# Patient Record
Sex: Male | Born: 1980 | Race: White | Hispanic: No | Marital: Married | Smoking: Never smoker
Health system: Southern US, Community
[De-identification: ages and names within clinical notes are randomized; demographics above are authoritative.]

## PROBLEM LIST (undated history)

## (undated) HISTORY — PX: FEMUR FRACTURE SURGERY: SHX633

---

## 2004-08-26 ENCOUNTER — Encounter: Payer: Self-pay | Admitting: Emergency Medicine

## 2004-08-26 ENCOUNTER — Ambulatory Visit (HOSPITAL_COMMUNITY): Admission: EM | Admit: 2004-08-26 | Discharge: 2004-08-26 | Payer: Self-pay | Admitting: Emergency Medicine

## 2004-09-05 ENCOUNTER — Ambulatory Visit (HOSPITAL_COMMUNITY): Admission: RE | Admit: 2004-09-05 | Discharge: 2004-09-05 | Payer: Self-pay | Admitting: Urology

## 2010-11-02 ENCOUNTER — Emergency Department (HOSPITAL_COMMUNITY)
Admission: EM | Admit: 2010-11-02 | Discharge: 2010-11-02 | Disposition: A | Discharge status: Home / Self Care | Payer: BC Managed Care – PPO | Attending: Emergency Medicine | Admitting: Emergency Medicine

## 2010-11-02 DIAGNOSIS — R109 Unspecified abdominal pain: Secondary | ICD-10-CM | POA: Insufficient documentation

## 2010-11-02 DIAGNOSIS — N200 Calculus of kidney: Secondary | ICD-10-CM | POA: Insufficient documentation

## 2010-11-02 LAB — URINALYSIS, ROUTINE W REFLEX MICROSCOPIC
Bilirubin Urine: NEGATIVE
Glucose, UA: NEGATIVE mg/dL
Ketones, ur: NEGATIVE mg/dL
Leukocytes, UA: NEGATIVE
Nitrite: NEGATIVE
Protein, ur: NEGATIVE mg/dL
Specific Gravity, Urine: 1.028 (ref 1.005–1.030)
Urobilinogen, UA: 0.2 mg/dL (ref 0.0–1.0)
pH: 5.5 (ref 5.0–8.0)

## 2010-11-02 LAB — URINE MICROSCOPIC-ADD ON

## 2011-03-17 ENCOUNTER — Encounter (HOSPITAL_COMMUNITY): Payer: Self-pay | Admitting: *Deleted

## 2011-03-17 ENCOUNTER — Emergency Department (HOSPITAL_COMMUNITY)
Admission: EM | Admit: 2011-03-17 | Discharge: 2011-03-17 | Disposition: A | Discharge status: Home / Self Care | Payer: BC Managed Care – PPO | Attending: Emergency Medicine | Admitting: Emergency Medicine

## 2011-03-17 ENCOUNTER — Emergency Department (HOSPITAL_COMMUNITY): Payer: BC Managed Care – PPO

## 2011-03-17 DIAGNOSIS — Z9889 Other specified postprocedural states: Secondary | ICD-10-CM | POA: Insufficient documentation

## 2011-03-17 DIAGNOSIS — N201 Calculus of ureter: Secondary | ICD-10-CM | POA: Insufficient documentation

## 2011-03-17 DIAGNOSIS — R109 Unspecified abdominal pain: Secondary | ICD-10-CM | POA: Insufficient documentation

## 2011-03-17 DIAGNOSIS — N200 Calculus of kidney: Secondary | ICD-10-CM

## 2011-03-17 LAB — URINALYSIS, ROUTINE W REFLEX MICROSCOPIC
Bilirubin Urine: NEGATIVE
Glucose, UA: NEGATIVE mg/dL
Ketones, ur: NEGATIVE mg/dL
Leukocytes, UA: NEGATIVE
Nitrite: NEGATIVE
Protein, ur: 30 mg/dL — AB
Specific Gravity, Urine: 1.033 — ABNORMAL HIGH (ref 1.005–1.030)
Urobilinogen, UA: 0.2 mg/dL (ref 0.0–1.0)
pH: 5.5 (ref 5.0–8.0)

## 2011-03-17 LAB — URINE MICROSCOPIC-ADD ON

## 2011-03-17 MED ORDER — OXYCODONE-ACETAMINOPHEN 5-325 MG PO TABS: 2.0000 | ORAL_TABLET | ORAL | Status: AC | PRN

## 2011-03-17 MED ORDER — TAMSULOSIN HCL 0.4 MG PO CAPS: 0.4000 mg | ORAL_CAPSULE | Freq: Every day | ORAL | Status: AC

## 2011-03-17 NOTE — ED Notes (Signed)
Pt denies any vomiting

## 2011-03-17 NOTE — Discharge Instructions (Signed)
You had a 4.5x78mm kidney stone in your left ureter. There is a high likelihood is that you may be able to pass your stone in the next 3 days. Use strainer when urinate.  Collect stone and bring it to your urologist for analysis.  Return if symptoms worsen.  Do not drive or operate heavy machinery while taking pain medication as it can cause drowsiness.   Kidney Stones Kidney stones (ureteral lithiasis) are deposits that form inside your kidneys. The intense pain is caused by the stone moving through the urinary tract. When the stone moves, the ureter goes into spasm around the stone. The stone is usually passed in the urine.  CAUSES   A disorder that makes certain neck glands produce too much parathyroid hormone (primary hyperparathyroidism).   A buildup of uric acid crystals.   Narrowing (stricture) of the ureter.   A kidney obstruction present at birth (congenital obstruction).   Previous surgery on the kidney or ureters.   Numerous kidney infections.  SYMPTOMS   Feeling sick to your stomach (nauseous).   Throwing up (vomiting).   Blood in the urine (hematuria).   Pain that usually spreads (radiates) to the groin.   Frequency or urgency of urination.  DIAGNOSIS   Taking a history and physical exam.   Blood or urine tests.   Computerized X-ray scan (CT scan).   Occasionally, an examination of the inside of the urinary bladder (cystoscopy) is performed.  TREATMENT   Observation.   Increasing your fluid intake.   Surgery may be needed if you have severe pain or persistent obstruction.  The size, location, and chemical composition are all important variables that will determine the proper choice of action for you. Talk to your caregiver to better understand your situation so that you will minimize the risk of injury to yourself and your kidney.  HOME CARE INSTRUCTIONS   Drink enough water and fluids to keep your urine clear or pale yellow.   Strain all urine through the  provided strainer. Keep all particulate matter and stones for your caregiver to see. The stone causing the pain may be as small as a grain of salt. It is very important to use the strainer each and every time you pass your urine. The collection of your stone will allow your caregiver to analyze it and verify that a stone has actually passed.   Only take over-the-counter or prescription medicines for pain, discomfort, or fever as directed by your caregiver.   Make a follow-up appointment with your caregiver as directed.   Get follow-up X-rays if required. The absence of pain does not always mean that the stone has passed. It may have only stopped moving. If the urine remains completely obstructed, it can cause loss of kidney function or even complete destruction of the kidney. It is your responsibility to make sure X-rays and follow-ups are completed. Ultrasounds of the kidney can show blockages and the status of the kidney. Ultrasounds are not associated with any radiation and can be performed easily in a matter of minutes.  SEEK IMMEDIATE MEDICAL CARE IF:   Pain cannot be controlled with the prescribed medicine.   You have a fever.   The severity or intensity of pain increases over 18 hours and is not relieved by pain medicine.   You develop a new onset of abdominal pain.   You feel faint or pass out.  MAKE SURE YOU:   Understand these instructions.   Will watch your condition.  Will get help right away if you are not doing well or get worse.  Document Released: 12/30/2004 Document Revised: 12/19/2010 Document Reviewed: 04/27/2009 [facility] Patient Information 2012 [place], [place].

## 2011-03-17 NOTE — ED Notes (Signed)
Pt states he started to have frequent urination about 3 weeks ago. Pt states he started to have left side flank pain today. Pt states he has been to see a urologist. Pt states they can not find anything but the pain is getting worst

## 2011-03-17 NOTE — ED Provider Notes (Signed)
History     CSN: [id number]  Arrival date & time 03/17/11  1315   First MD Initiated Contact with Patient 03/17/11 1441      Chief Complaint  Patient presents with  . Flank Pain    (Consider location/radiation/quality/duration/timing/severity/associated sxs/prior treatment) HPI  A 31 year old [place] with history of kidney stones that required lithotripsy 6 years ago is presenting to the ED with a chief complaint of left flank pain. Patient states since his lithotripsy he has been having intermittent left flank pain very similar to the site of his prior kidney stone. Pain usually lasted for 30 minutes and usually improves with ibuprofen. Pain is described as a sharp/throbbing sensation that is nonradiating. Today the pain returns but has not resolved like it normal does. He tries taking some ibuprofen without relief. He denies fever, headache, chest pain, shortness of breath, abdominal pain, dysuria although he does notice some urinary frequency. No evidence of hematuria. He is having no trouble having bowel movements and denies constipation or diarrhea. He denies any recent trauma. Pain that has not improved with movement. He denies any recent strenuous exercise.  History reviewed. No pertinent past medical history.  Past Surgical History  Procedure Date  . Femur fracture surgery     No family history on file.  History  Substance Use Topics  . Smoking status: Never Smoker   . Smokeless tobacco: Not on file  . Alcohol Use: Yes      Review of Systems  All other systems reviewed and are negative.    Allergies  Amoxicillin  Home Medications   Current Outpatient Rx  Name Route Sig Dispense Refill  . IBUPROFEN 200 MG PO TABS Oral Take 600 mg by mouth every 6 (six) hours as needed. For pain      BP 144/89  Pulse 65  Temp(Src) 98.1 F (36.7 C) (Oral)  Resp 18  Ht 5\' 10"  (1.778 m)  Wt 190 lb (86.183 kg)  BMI 27.26 kg/m2  SpO2 99%  Physical Exam  Nursing note and  vitals reviewed. Constitutional:       Awake, alert, nontoxic appearance  HENT:  Head: Atraumatic.  Eyes: Right eye exhibits no discharge. Left eye exhibits no discharge.  Neck: Neck supple.  Pulmonary/Chest: Effort normal. He exhibits no tenderness.  Abdominal: There is no tenderness. There is CVA tenderness. There is no rebound.  Musculoskeletal: He exhibits no tenderness.       Baseline ROM, no obvious new focal weakness  Neurological:       Mental status and motor strength appears baseline for patient and situation  Skin: No rash noted.     Psychiatric: He has a normal mood and affect.    ED Course  Procedures (including critical care time)   Labs Reviewed  URINALYSIS, ROUTINE W REFLEX MICROSCOPIC   No results found.   No diagnosis found.  Results for orders placed during the hospital encounter of 03/17/11  URINALYSIS, ROUTINE W REFLEX MICROSCOPIC      Component Value Range   Color, Urine YELLOW  YELLOW    APPearance CLOUDY (*) CLEAR    Specific Gravity, Urine 1.033 (*) 1.005 - 1.030    pH 5.5  5.0 - 8.0    Glucose, UA NEGATIVE  NEGATIVE (mg/dL)   Hgb urine dipstick LARGE (*) NEGATIVE    Bilirubin Urine NEGATIVE  NEGATIVE    Ketones, ur NEGATIVE  NEGATIVE (mg/dL)   Protein, ur 30 (*) NEGATIVE (mg/dL)   Urobilinogen, UA 0.2  0.0 - 1.0 (mg/dL)   Nitrite NEGATIVE  NEGATIVE    Leukocytes, UA NEGATIVE  NEGATIVE   URINE MICROSCOPIC-ADD ON      Component Value Range   Squamous Epithelial / LPF FEW (*) RARE    WBC, UA 0-2  <3 (WBC/hpf)   RBC / HPF 11-20  <3 (RBC/hpf)   Bacteria, UA FEW (*) RARE    Urine-Other MUCOUS PRESENT     Ct Abdomen Pelvis Wo Contrast  03/17/2011  *RADIOLOGY REPORT*  Clinical Data: Left flank pain  CT ABDOMEN AND PELVIS WITHOUT CONTRAST  Technique:  Multidetector CT imaging of the abdomen and pelvis was performed following the standard protocol without intravenous contrast.  Comparison: 08/26/2004  Findings: Minimal dependent basilar  atelectasis.  Normal heart size.  No pericardial or pleural effusion.  No hiatal hernia.  Abdomen:  Left kidney demonstrates very minor perinephric inflammatory edema.  Mild left hydronephrosis and hydroureter. This is secondary to a mildly obstructing left UVJ calculus measuring 4.5 x 6 mm, image 78.  No other additional upper urinary tract calculi.  Right kidney and ureter demonstrate no acute finding or obstructive stone disease.  Liver, gallbladder, biliary system, pancreas, spleen, and adrenal glands are within normal limits for noncontrast study.  Negative for bowel obstruction, dilatation, ileus, or free air.  No abdominal free fluid, fluid collection, hemorrhage, hematoma, adenopathy, or abscess.  Normal appendix.  Pelvis:  No pelvic free fluid, fluid collection, hemorrhage, or adenopathy.  Distal colon is decompressed.  No inguinal abnormality.  No acute osseous finding evident.  IMPRESSION: 4.5 x 6 mm mildly obstructing left UVJ calculus.  Mild associated left hydronephrosis and hydroureter.  Original Report Authenticated By: Judie Petit. Ruel Favors, M.D.      MDM  History of kidney stones. Presents with left leg pain. Patient is appearing in no acute distress. Mild tenderness to left flank on percussion. No other symptoms. Will obtain a UA first. Patient does not request for pain medication at this time.  4:12 PM UA shows large amount of hemoglobin which supports possible kidney stone. Abdominal and pelvis CT without contrast has been ordered.   4:40 PM Abdominal and pelvis CT scan reviewed by me, shows a 4.5x6 mm mildly obstructing left UVJ calculus with some associated left hydronephrosis and hydroureter. Findings consistent with patient presenting complaint. His urine shows no evidence of infection. At this time, the patient will be discharged with Flomax, pain medication, a urine strainer and followup obstruction. Results were discussed with patient who agrees with plan.   Fayrene Helper,  PA-C 03/17/11 1642  Fayrene Helper, PA-C 03/17/11 [phone]

## 2011-03-17 NOTE — ED Notes (Signed)
Pt given discharge info and rx, amb with steady gait to discharge window.

## 2011-03-18 NOTE — ED Provider Notes (Signed)
Medical screening examination/treatment/procedure(s) were performed by non-physician practitioner and as supervising physician I was immediately available for consultation/collaboration.   Sofia Jaquith A. Patrica Duel, MD 03/18/11 [phone]

## 2012-10-06 IMAGING — CT CT ABD-PELV W/O CM
1 of 2 series · 15 of 32 positions shown, 19 images · non-contrast
Comparison: 08/26/2004

CLINICAL DATA: Left flank pain

CT ABDOMEN AND PELVIS WITHOUT CONTRAST
TECHNIQUE: Multidetector CT imaging of the abdomen and pelvis was
performed following the standard protocol without intravenous
contrast.

[Series 2: abd/pel w/o · axial · non-contrast · 0.74mm/px · z∈[-506,-71]mm · 15 of 95 slices shown, 19 images]
[im 4/95  soft-tissue]
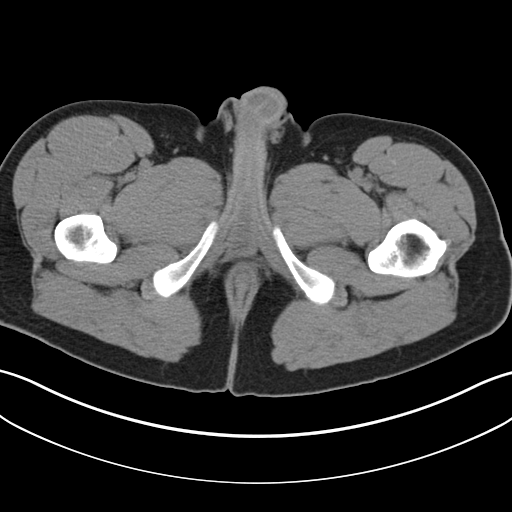
[im 4/95  bone]
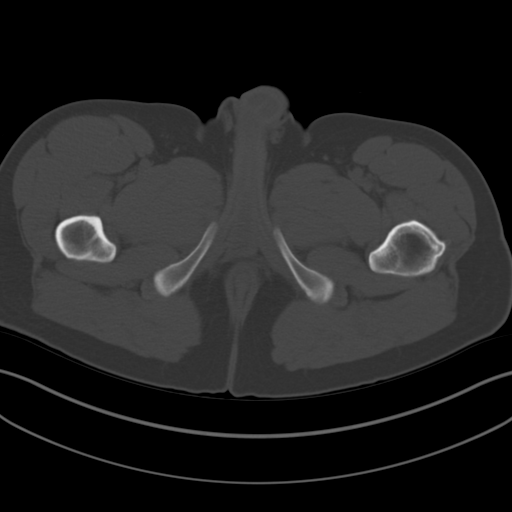
[im 12/95  soft-tissue]
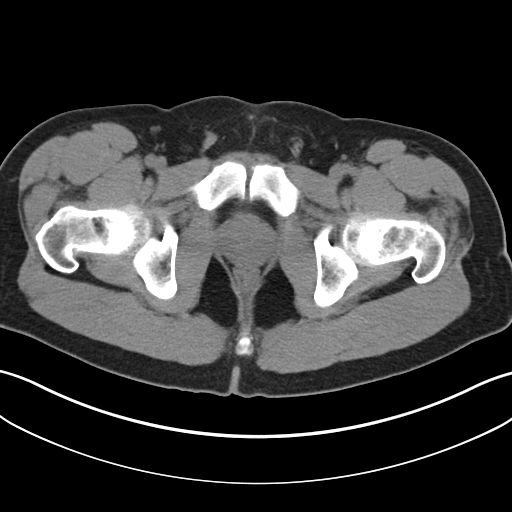
[im 19/95  soft-tissue]
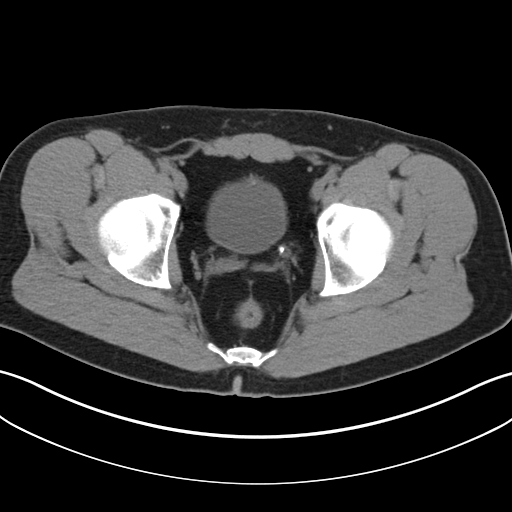
[im 27/95  soft-tissue]
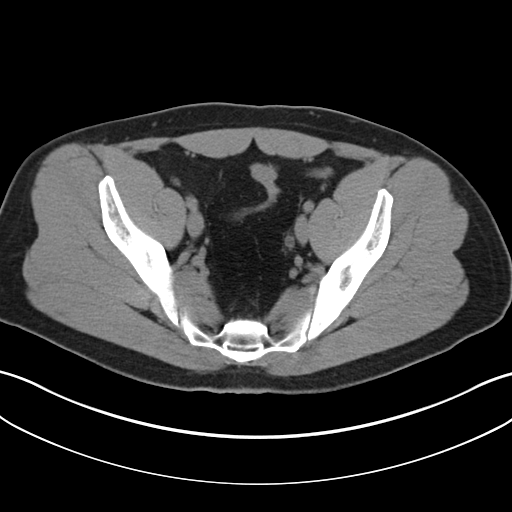
[im 34/95  soft-tissue]
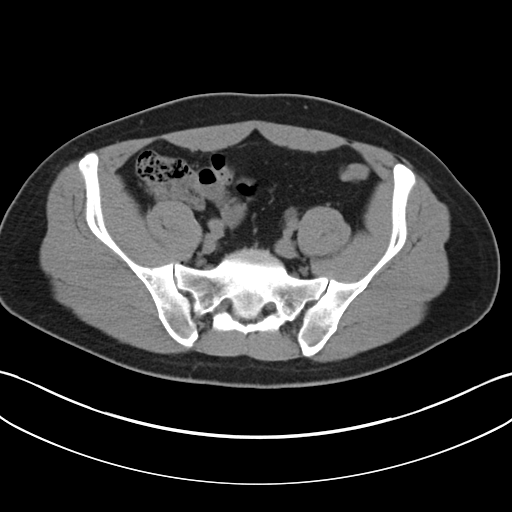
[im 42/95  soft-tissue]
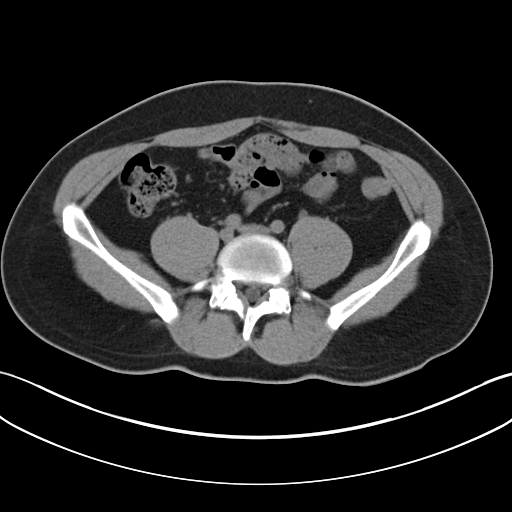
[im 49/95  soft-tissue]
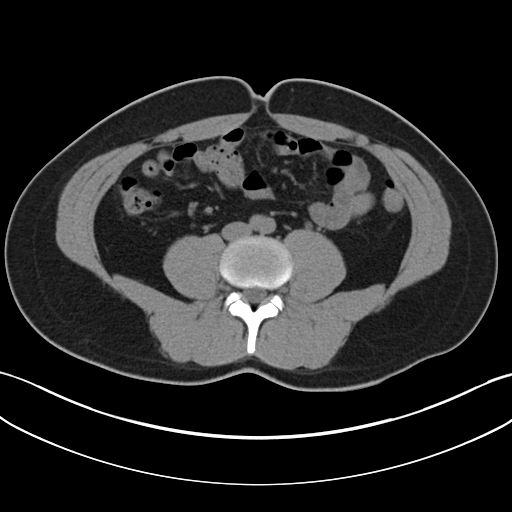
[im 53/95  soft-tissue]
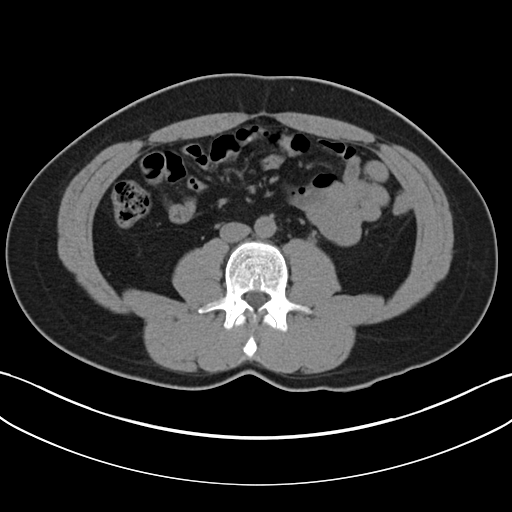
[im 61/95  soft-tissue]
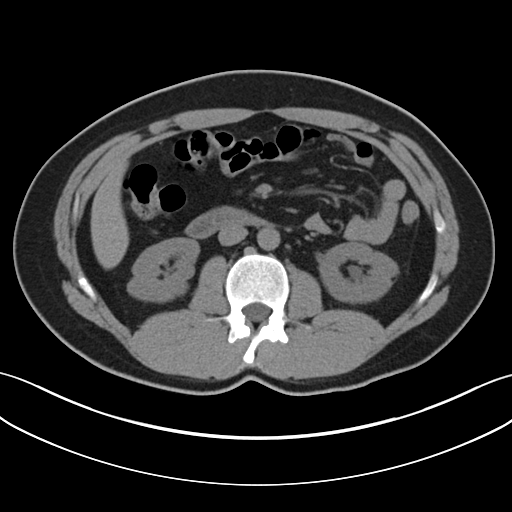
[im 61/95  bone]
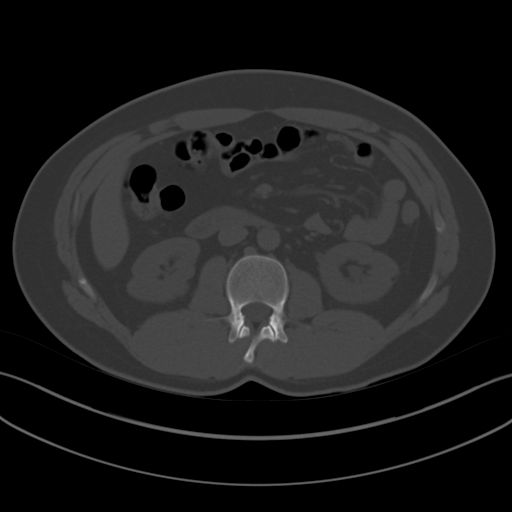
[im 68/95  soft-tissue]
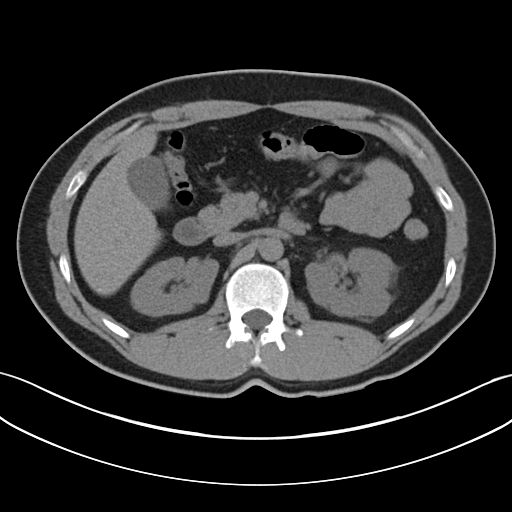
[im 76/95  soft-tissue]
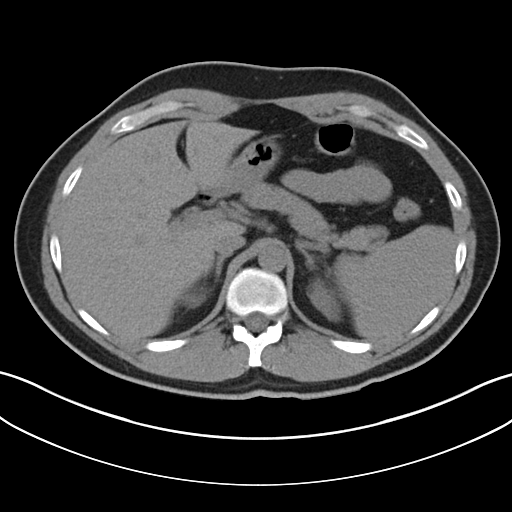
[im 79/95  lung]
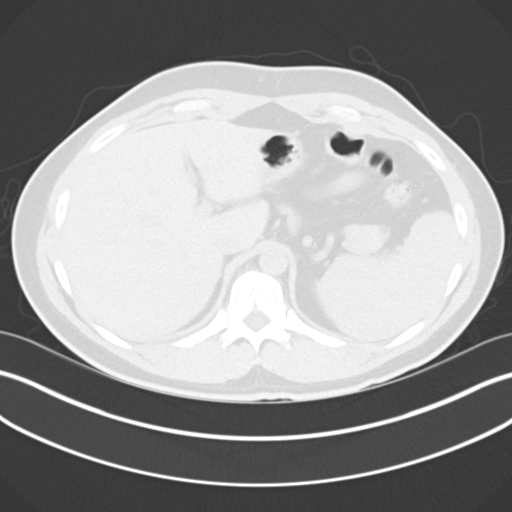
[im 83/95  soft-tissue]
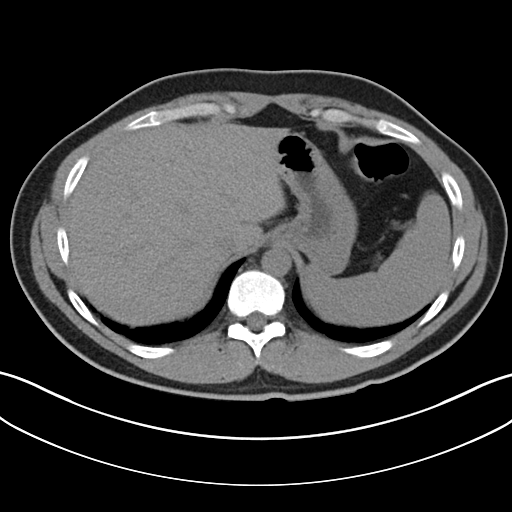
[im 83/95  lung]
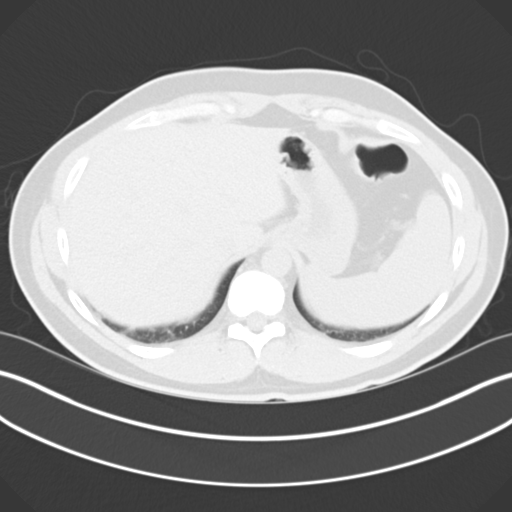
[im 87/95  lung]
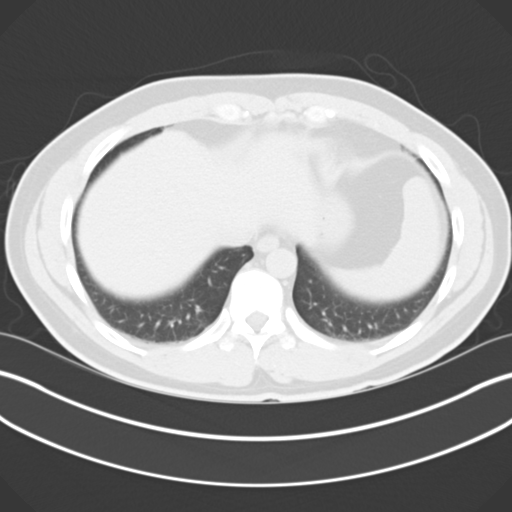
[im 91/95  soft-tissue]
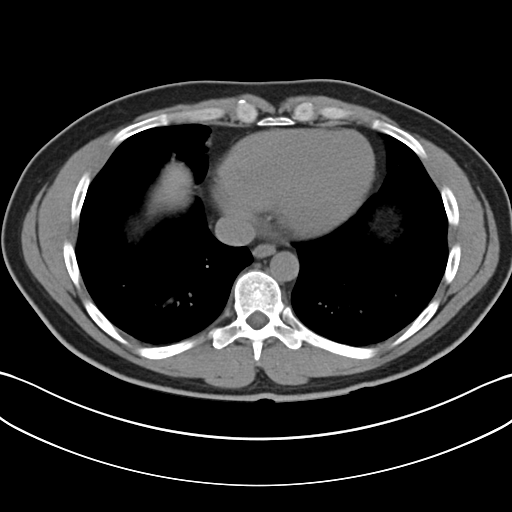
[im 91/95  lung]
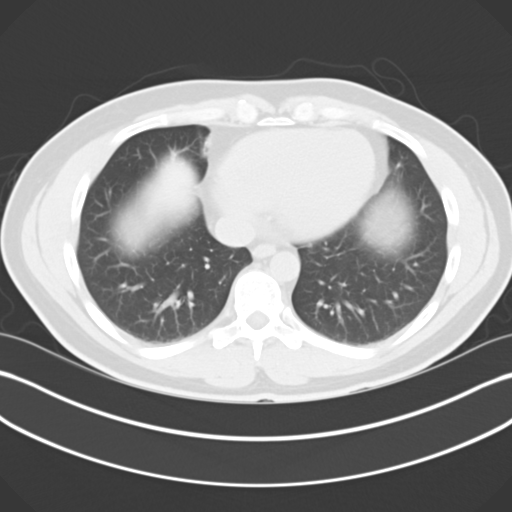

[15 of 32 positions shown; findings below may reference images not displayed]

FINDINGS: Minimal dependent basilar atelectasis.  Normal heart
size.  No pericardial or pleural effusion.  No hiatal hernia.

Abdomen:  Left kidney demonstrates very minor perinephric
inflammatory edema.  Mild left hydronephrosis and hydroureter.
This is secondary to a mildly obstructing left UVJ calculus
measuring 4.5 x 6 mm, image 78.

No other additional upper urinary tract calculi.  Right kidney and
ureter demonstrate no acute finding or obstructive stone disease.

Liver, gallbladder, biliary system, pancreas, spleen, and adrenal
glands are within normal limits for noncontrast study.

Negative for bowel obstruction, dilatation, ileus, or free air.

No abdominal free fluid, fluid collection, hemorrhage, hematoma,
adenopathy, or abscess.  Normal appendix.

Pelvis:  No pelvic free fluid, fluid collection, hemorrhage, or
adenopathy.  Distal colon is decompressed.  No inguinal
abnormality.

No acute osseous finding evident.
IMPRESSION: 4.5 x 6 mm mildly obstructing left UVJ calculus.  Mild associated
left hydronephrosis and hydroureter.

## 2016-12-03 DIAGNOSIS — J020 Streptococcal pharyngitis: Secondary | ICD-10-CM | POA: Diagnosis not present

## 2016-12-03 DIAGNOSIS — J029 Acute pharyngitis, unspecified: Secondary | ICD-10-CM | POA: Diagnosis not present

## 2017-01-23 DIAGNOSIS — E781 Pure hyperglyceridemia: Secondary | ICD-10-CM | POA: Diagnosis not present

## 2017-01-23 DIAGNOSIS — Z23 Encounter for immunization: Secondary | ICD-10-CM | POA: Diagnosis not present

## 2017-01-23 DIAGNOSIS — Z0000 Encounter for general adult medical examination without abnormal findings: Secondary | ICD-10-CM | POA: Diagnosis not present

## 2018-01-25 ENCOUNTER — Encounter: Payer: Self-pay | Admitting: Physician Assistant

## 2018-01-28 DIAGNOSIS — Z125 Encounter for screening for malignant neoplasm of prostate: Secondary | ICD-10-CM | POA: Diagnosis not present

## 2018-01-28 DIAGNOSIS — Z833 Family history of diabetes mellitus: Secondary | ICD-10-CM | POA: Diagnosis not present

## 2018-01-28 DIAGNOSIS — E781 Pure hyperglyceridemia: Secondary | ICD-10-CM | POA: Diagnosis not present

## 2018-01-28 DIAGNOSIS — Z0000 Encounter for general adult medical examination without abnormal findings: Secondary | ICD-10-CM | POA: Diagnosis not present

## 2018-02-01 NOTE — Progress Notes (Signed)
This encounter was created in error - please disregard.

## 2019-03-14 DIAGNOSIS — Z3009 Encounter for other general counseling and advice on contraception: Secondary | ICD-10-CM | POA: Diagnosis not present

## 2019-03-28 DIAGNOSIS — N4341 Spermatocele of epididymis, single: Secondary | ICD-10-CM | POA: Diagnosis not present

## 2019-04-20 DIAGNOSIS — Z302 Encounter for sterilization: Secondary | ICD-10-CM | POA: Diagnosis not present

## 2019-04-27 DIAGNOSIS — E781 Pure hyperglyceridemia: Secondary | ICD-10-CM | POA: Diagnosis not present

## 2019-04-27 DIAGNOSIS — Z0000 Encounter for general adult medical examination without abnormal findings: Secondary | ICD-10-CM | POA: Diagnosis not present

## 2020-07-10 DIAGNOSIS — E781 Pure hyperglyceridemia: Secondary | ICD-10-CM | POA: Diagnosis not present

## 2020-07-10 DIAGNOSIS — Z0000 Encounter for general adult medical examination without abnormal findings: Secondary | ICD-10-CM | POA: Diagnosis not present

## 2020-08-27 DIAGNOSIS — F411 Generalized anxiety disorder: Secondary | ICD-10-CM | POA: Diagnosis not present

## 2020-09-27 DIAGNOSIS — F411 Generalized anxiety disorder: Secondary | ICD-10-CM | POA: Diagnosis not present

## 2021-07-25 DIAGNOSIS — F411 Generalized anxiety disorder: Secondary | ICD-10-CM | POA: Diagnosis not present

## 2021-07-25 DIAGNOSIS — E669 Obesity, unspecified: Secondary | ICD-10-CM | POA: Diagnosis not present

## 2021-07-25 DIAGNOSIS — Z0000 Encounter for general adult medical examination without abnormal findings: Secondary | ICD-10-CM | POA: Diagnosis not present

## 2021-07-25 DIAGNOSIS — E781 Pure hyperglyceridemia: Secondary | ICD-10-CM | POA: Diagnosis not present

## 2021-11-01 DIAGNOSIS — F411 Generalized anxiety disorder: Secondary | ICD-10-CM | POA: Diagnosis not present

## 2021-11-01 DIAGNOSIS — E781 Pure hyperglyceridemia: Secondary | ICD-10-CM | POA: Diagnosis not present

## 2021-12-26 DIAGNOSIS — N50812 Left testicular pain: Secondary | ICD-10-CM | POA: Diagnosis not present

## 2022-07-30 DIAGNOSIS — F411 Generalized anxiety disorder: Secondary | ICD-10-CM | POA: Diagnosis not present

## 2022-07-30 DIAGNOSIS — E781 Pure hyperglyceridemia: Secondary | ICD-10-CM | POA: Diagnosis not present

## 2022-07-30 DIAGNOSIS — Z0000 Encounter for general adult medical examination without abnormal findings: Secondary | ICD-10-CM | POA: Diagnosis not present

## 2023-08-03 DIAGNOSIS — E781 Pure hyperglyceridemia: Secondary | ICD-10-CM | POA: Diagnosis not present

## 2023-08-03 DIAGNOSIS — Z0000 Encounter for general adult medical examination without abnormal findings: Secondary | ICD-10-CM | POA: Diagnosis not present

## 2023-08-03 DIAGNOSIS — Z6831 Body mass index (BMI) 31.0-31.9, adult: Secondary | ICD-10-CM | POA: Diagnosis not present

## 2023-08-03 DIAGNOSIS — F411 Generalized anxiety disorder: Secondary | ICD-10-CM | POA: Diagnosis not present
